# Patient Record
Sex: Female | Born: 1970 | Race: White | Hispanic: No | Marital: Married | Smoking: Never smoker
Health system: Southern US, Community
[De-identification: ages and names within clinical notes are randomized; demographics above are authoritative.]

## PROBLEM LIST (undated history)

## (undated) DIAGNOSIS — D242 Benign neoplasm of left breast: Secondary | ICD-10-CM

## (undated) DIAGNOSIS — N189 Chronic kidney disease, unspecified: Secondary | ICD-10-CM

## (undated) HISTORY — PX: KIDNEY STONE SURGERY: SHX686

---

## 1998-11-06 ENCOUNTER — Inpatient Hospital Stay (HOSPITAL_COMMUNITY): Admission: AD | Admit: 1998-11-06 | Discharge: 1998-11-08 | Payer: Self-pay | Admitting: Obstetrics and Gynecology

## 1998-11-09 ENCOUNTER — Encounter: Admission: RE | Admit: 1998-11-09 | Discharge: 1999-02-07 | Payer: Self-pay | Admitting: Obstetrics and Gynecology

## 1998-12-07 ENCOUNTER — Other Ambulatory Visit: Admission: RE | Admit: 1998-12-07 | Discharge: 1998-12-07 | Payer: Self-pay | Admitting: Obstetrics and Gynecology

## 1999-09-22 ENCOUNTER — Other Ambulatory Visit: Admission: RE | Admit: 1999-09-22 | Discharge: 1999-09-22 | Payer: Self-pay | Admitting: Obstetrics and Gynecology

## 2000-11-07 ENCOUNTER — Other Ambulatory Visit: Admission: RE | Admit: 2000-11-07 | Discharge: 2000-11-07 | Payer: Self-pay | Admitting: Obstetrics and Gynecology

## 2001-11-12 ENCOUNTER — Other Ambulatory Visit: Admission: RE | Admit: 2001-11-12 | Discharge: 2001-11-12 | Payer: Self-pay | Admitting: Obstetrics and Gynecology

## 2002-11-14 ENCOUNTER — Other Ambulatory Visit: Admission: RE | Admit: 2002-11-14 | Discharge: 2002-11-14 | Payer: Self-pay | Admitting: Obstetrics and Gynecology

## 2003-11-27 ENCOUNTER — Other Ambulatory Visit: Admission: RE | Admit: 2003-11-27 | Discharge: 2003-11-27 | Payer: Self-pay | Admitting: Obstetrics and Gynecology

## 2004-11-29 ENCOUNTER — Other Ambulatory Visit: Admission: RE | Admit: 2004-11-29 | Discharge: 2004-11-29 | Payer: Self-pay | Admitting: Obstetrics and Gynecology

## 2013-02-01 ENCOUNTER — Other Ambulatory Visit: Payer: Self-pay | Admitting: Obstetrics and Gynecology

## 2013-02-01 DIAGNOSIS — R928 Other abnormal and inconclusive findings on diagnostic imaging of breast: Secondary | ICD-10-CM

## 2013-02-06 ENCOUNTER — Ambulatory Visit
Admission: RE | Admit: 2013-02-06 | Discharge: 2013-02-06 | Disposition: A | Discharge status: Home / Self Care | Payer: BC Managed Care – PPO | Source: Ambulatory Visit | Attending: Obstetrics and Gynecology | Admitting: Obstetrics and Gynecology

## 2013-02-06 DIAGNOSIS — R928 Other abnormal and inconclusive findings on diagnostic imaging of breast: Secondary | ICD-10-CM

## 2013-02-07 ENCOUNTER — Other Ambulatory Visit: Payer: Self-pay

## 2013-02-13 ENCOUNTER — Other Ambulatory Visit: Payer: Self-pay

## 2014-12-29 ENCOUNTER — Other Ambulatory Visit: Payer: Self-pay | Admitting: Obstetrics and Gynecology

## 2014-12-29 DIAGNOSIS — N6452 Nipple discharge: Secondary | ICD-10-CM

## 2014-12-30 ENCOUNTER — Ambulatory Visit
Admission: RE | Admit: 2014-12-30 | Discharge: 2014-12-30 | Disposition: A | Discharge status: Home / Self Care | Payer: BC Managed Care – PPO | Source: Ambulatory Visit | Attending: Obstetrics and Gynecology | Admitting: Obstetrics and Gynecology

## 2014-12-30 ENCOUNTER — Other Ambulatory Visit: Payer: Self-pay | Admitting: Obstetrics and Gynecology

## 2014-12-30 DIAGNOSIS — N6452 Nipple discharge: Secondary | ICD-10-CM

## 2015-01-01 ENCOUNTER — Ambulatory Visit
Admission: RE | Admit: 2015-01-01 | Discharge: 2015-01-01 | Disposition: A | Discharge status: Home / Self Care | Payer: No Typology Code available for payment source | Source: Ambulatory Visit | Attending: Obstetrics and Gynecology | Admitting: Obstetrics and Gynecology

## 2015-01-01 DIAGNOSIS — N6452 Nipple discharge: Secondary | ICD-10-CM

## 2015-01-30 ENCOUNTER — Other Ambulatory Visit: Payer: Self-pay | Admitting: Surgery

## 2015-01-30 DIAGNOSIS — D242 Benign neoplasm of left breast: Secondary | ICD-10-CM

## 2015-02-02 ENCOUNTER — Other Ambulatory Visit: Payer: Self-pay | Admitting: Surgery

## 2015-02-02 ENCOUNTER — Encounter (HOSPITAL_BASED_OUTPATIENT_CLINIC_OR_DEPARTMENT_OTHER): Payer: Self-pay | Admitting: *Deleted

## 2015-02-02 DIAGNOSIS — D242 Benign neoplasm of left breast: Secondary | ICD-10-CM

## 2015-02-04 ENCOUNTER — Ambulatory Visit
Admission: RE | Admit: 2015-02-04 | Discharge: 2015-02-04 | Disposition: A | Discharge status: Home / Self Care | Payer: No Typology Code available for payment source | Source: Ambulatory Visit | Attending: Surgery | Admitting: Surgery

## 2015-02-04 DIAGNOSIS — D242 Benign neoplasm of left breast: Secondary | ICD-10-CM

## 2015-02-05 NOTE — H&P (Signed)
April Wilcox  Location: [facility] Patient #: [id number] DOB: 1970/03/11 Married / Language: English / Race: White Female  History of Present Illness   Patient words: left breast.   The patient is a 45 year old female who presents with a complaint of Left intraductal papillary lesion.   Her PCP is Lucita Lora, NP.  She works for [employer], Technical sales engineer.  She is accompanied by her mother in law.   The patient has noticed a nipple discharge for a few months. At first she thought this was clear, but then noticed it turned yellow. Her last mammogram was February 2016. About 2 years ago she had a recall for her left breast which proved to be benign. She had a left breast biopsy on 01/01/2015 ([id number]) which showed a intraductal papillary lesion.  She is having regular periods. Her last minstral period was January 01, 2015. She has no family history of breast cancer.  Plan: left breast lumpectomy with seed loc  Past Medical history: 1. Hemorrhoids  Social history: Married. 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 Has 2 children: 3 and 39 yo. Accompanied by mother in law  Allergies Marjean Donna, [place]; 01/23/2015 2:53 PM) No Known Drug Allergies01/06/2015  Medication History Davy Pique Bynum, CMA; 01/23/2015 2:53 PM) No Current Medications Medications Reconciled  Vitals (Sonya Bynum CMA; 01/23/2015 2:52 PM) 01/23/2015 2:52 PM Weight: 145 lb Height: 64in Body Surface Area: 1.71 m Body Mass Index: 24.89 kg/m  Pulse: 76 (Regular)  BP: 122/80 (Sitting, Left Arm, Standard)   Physical Exam  General: WN WF alert and generally healthy appearing. HEENT: Normal. Pupils equal.  Neck: Supple. No mass. No thyroid mass.  Lymph Nodes: No supraclavicular or cervical nodes.  Lungs: Clear to auscultation and symmetric breath sounds. Heart: RRR. No murmur or rub.  Breasts: Right: No mass. Left: Entry site for  biopsy at 5 o'clock. No mass in the sub areola area. No nipple discharge.  Abdomen: Soft. No mass. No tenderness. No hernia. Normal bowel sounds. Has umbilical ring.  Extremities: Good strength and ROM in upper and lower extremities.  Neurologic: Grossly intact to motor and sensory function. Psychiatric: Has normal mood and affect. Behavior is normal.   Assessment & Plan  1.  INTRADUCTAL PAPILLOMA OF BREAST, LEFT (D24.2)  Story: She had a left breast biopsy on 01/01/2015 ([id number]) which showed a intraductal papillary lesion.  Plan -   Excision biopsy of papilloma (seed localization)     Seed placed at [facility] on 02/04/2015   Alphonsa Overall, MD, [facility] Pager: [phone] Office phone:  [phone]

## 2015-02-05 NOTE — Anesthesia Preprocedure Evaluation (Addendum)
Anesthesia Evaluation  Patient identified by MRN, date of birth, ID band Patient awake    History of Anesthesia Complications Negative for: history of anesthetic complications  Airway Mallampati: I  TM Distance: >3 FB Neck ROM: Full    Dental  (+) Teeth Intact   Pulmonary neg pulmonary ROS,    breath sounds clear to auscultation       Cardiovascular negative cardio ROS   Rhythm:Regular Rate:Normal     Neuro/Psych    GI/Hepatic negative GI ROS, Neg liver ROS,   Endo/Other  negative endocrine ROS  Renal/GU negative Renal ROS     Musculoskeletal   Abdominal   Peds  Hematology   Anesthesia Other Findings   Reproductive/Obstetrics                            Anesthesia Physical Anesthesia Plan  ASA: I  Anesthesia Plan: General   Post-op Pain Management:    Induction: Intravenous  Airway Management Planned: LMA  Additional Equipment:   Intra-op Plan:   Post-operative Plan: Extubation in OR  Informed Consent: I have reviewed the patients History and Physical, chart, labs and discussed the procedure including the risks, benefits and alternatives for the proposed anesthesia with the patient or authorized representative who has indicated his/her understanding and acceptance.     Plan Discussed with:   Anesthesia Plan Comments:        Anesthesia Quick Evaluation

## 2015-02-06 ENCOUNTER — Ambulatory Visit (HOSPITAL_BASED_OUTPATIENT_CLINIC_OR_DEPARTMENT_OTHER): Payer: BC Managed Care – PPO | Admitting: Anesthesiology

## 2015-02-06 ENCOUNTER — Encounter (HOSPITAL_BASED_OUTPATIENT_CLINIC_OR_DEPARTMENT_OTHER)
Admission: RE | Disposition: A | Discharge status: Home / Self Care | Payer: Self-pay | Source: Ambulatory Visit | Attending: Surgery

## 2015-02-06 ENCOUNTER — Ambulatory Visit
Admission: RE | Admit: 2015-02-06 | Discharge: 2015-02-06 | Disposition: A | Discharge status: Home / Self Care | Payer: No Typology Code available for payment source | Source: Ambulatory Visit | Attending: Surgery | Admitting: Surgery

## 2015-02-06 ENCOUNTER — Ambulatory Visit (HOSPITAL_BASED_OUTPATIENT_CLINIC_OR_DEPARTMENT_OTHER)
Admission: RE | Admit: 2015-02-06 | Discharge: 2015-02-06 | Disposition: A | Discharge status: Home / Self Care | Payer: BC Managed Care – PPO | Source: Ambulatory Visit | Attending: Surgery | Admitting: Surgery

## 2015-02-06 ENCOUNTER — Encounter (HOSPITAL_BASED_OUTPATIENT_CLINIC_OR_DEPARTMENT_OTHER): Payer: Self-pay

## 2015-02-06 DIAGNOSIS — D242 Benign neoplasm of left breast: Secondary | ICD-10-CM | POA: Insufficient documentation

## 2015-02-06 HISTORY — DX: Benign neoplasm of left breast: D24.2

## 2015-02-06 HISTORY — DX: Chronic kidney disease, unspecified: N18.9

## 2015-02-06 HISTORY — PX: BREAST LUMPECTOMY WITH RADIOACTIVE SEED LOCALIZATION: SHX6424

## 2015-02-06 SURGERY — BREAST LUMPECTOMY WITH RADIOACTIVE SEED LOCALIZATION
Anesthesia: General | Site: Breast | Laterality: Left

## 2015-02-06 MED ORDER — CHLORHEXIDINE GLUCONATE 4 % EX LIQD: 1.0000 "application " | Freq: Once | CUTANEOUS | Status: DC

## 2015-02-06 MED ORDER — BUPIVACAINE-EPINEPHRINE (PF) 0.5% -1:200000 IJ SOLN
INTRAMUSCULAR | Status: DC | PRN
  Administered 2015-02-06: 30 mL

## 2015-02-06 MED ORDER — LACTATED RINGERS IV SOLN
INTRAVENOUS | Status: DC
  Administered 2015-02-06: 07:00:00 via INTRAVENOUS

## 2015-02-06 MED ORDER — SUCCINYLCHOLINE CHLORIDE 20 MG/ML IJ SOLN
INTRAMUSCULAR | Status: AC
  Filled 2015-02-06: qty 1

## 2015-02-06 MED ORDER — PROPOFOL 10 MG/ML IV BOLUS
INTRAVENOUS | Status: DC | PRN
  Administered 2015-02-06: 120 mg via INTRAVENOUS

## 2015-02-06 MED ORDER — DEXAMETHASONE SODIUM PHOSPHATE 10 MG/ML IJ SOLN
INTRAMUSCULAR | Status: AC
  Filled 2015-02-06: qty 1

## 2015-02-06 MED ORDER — SCOPOLAMINE 1 MG/3DAYS TD PT72: 1.0000 | MEDICATED_PATCH | Freq: Once | TRANSDERMAL | Status: DC

## 2015-02-06 MED ORDER — PROMETHAZINE HCL 25 MG/ML IJ SOLN
6.2500 mg | INTRAMUSCULAR | Status: DC | PRN
Start: 2015-02-06 — End: 2015-02-06

## 2015-02-06 MED ORDER — DEXAMETHASONE SODIUM PHOSPHATE 4 MG/ML IJ SOLN
INTRAMUSCULAR | Status: DC | PRN
  Administered 2015-02-06: 10 mg via INTRAVENOUS

## 2015-02-06 MED ORDER — HYDROMORPHONE HCL 1 MG/ML IJ SOLN: 0.2500 mg | INTRAMUSCULAR | Status: DC | PRN

## 2015-02-06 MED ORDER — LIDOCAINE HCL (CARDIAC) 20 MG/ML IV SOLN
INTRAVENOUS | Status: DC | PRN
  Administered 2015-02-06: 80 mg via INTRAVENOUS

## 2015-02-06 MED ORDER — MIDAZOLAM HCL 2 MG/2ML IJ SOLN
INTRAMUSCULAR | Status: AC
  Filled 2015-02-06: qty 2

## 2015-02-06 MED ORDER — BUPIVACAINE-EPINEPHRINE (PF) 0.25% -1:200000 IJ SOLN
INTRAMUSCULAR | Status: AC
  Filled 2015-02-06: qty 30

## 2015-02-06 MED ORDER — FENTANYL CITRATE (PF) 100 MCG/2ML IJ SOLN
INTRAMUSCULAR | Status: AC
  Filled 2015-02-06: qty 2

## 2015-02-06 MED ORDER — ONDANSETRON HCL 4 MG/2ML IJ SOLN
INTRAMUSCULAR | Status: DC | PRN
  Administered 2015-02-06: 4 mg via INTRAVENOUS

## 2015-02-06 MED ORDER — CEFAZOLIN SODIUM-DEXTROSE 2-3 GM-% IV SOLR
INTRAVENOUS | Status: AC
  Filled 2015-02-06: qty 50

## 2015-02-06 MED ORDER — PROPOFOL 500 MG/50ML IV EMUL
INTRAVENOUS | Status: AC
  Filled 2015-02-06: qty 50

## 2015-02-06 MED ORDER — FENTANYL CITRATE (PF) 100 MCG/2ML IJ SOLN
50.0000 ug | INTRAMUSCULAR | Status: DC | PRN
  Administered 2015-02-06 (×2): 50 ug via INTRAVENOUS

## 2015-02-06 MED ORDER — ONDANSETRON HCL 4 MG/2ML IJ SOLN
INTRAMUSCULAR | Status: AC
  Filled 2015-02-06: qty 2

## 2015-02-06 MED ORDER — LIDOCAINE HCL (CARDIAC) 20 MG/ML IV SOLN
INTRAVENOUS | Status: AC
  Filled 2015-02-06: qty 5

## 2015-02-06 MED ORDER — BUPIVACAINE-EPINEPHRINE (PF) 0.5% -1:200000 IJ SOLN
INTRAMUSCULAR | Status: AC
  Filled 2015-02-06: qty 30

## 2015-02-06 MED ORDER — CEFAZOLIN SODIUM-DEXTROSE 2-3 GM-% IV SOLR
2.0000 g | INTRAVENOUS | Status: AC
  Administered 2015-02-06: 2 g via INTRAVENOUS

## 2015-02-06 MED ORDER — MIDAZOLAM HCL 2 MG/2ML IJ SOLN
1.0000 mg | INTRAMUSCULAR | Status: DC | PRN
Start: 2015-02-06 — End: 2015-02-06
  Administered 2015-02-06: 2 mg via INTRAVENOUS

## 2015-02-06 MED ORDER — HYDROCODONE-ACETAMINOPHEN 5-325 MG PO TABS: 1.0000 | ORAL_TABLET | Freq: Four times a day (QID) | ORAL | Status: DC | PRN

## 2015-02-06 MED ORDER — GLYCOPYRROLATE 0.2 MG/ML IJ SOLN: 0.2000 mg | Freq: Once | INTRAMUSCULAR | Status: DC | PRN

## 2015-02-06 SURGICAL SUPPLY — 52 items
APL SKNCLS STERI-STRIP NONHPOA (GAUZE/BANDAGES/DRESSINGS)
BENZOIN TINCTURE PRP APPL 2/3 (GAUZE/BANDAGES/DRESSINGS) IMPLANT
BINDER BREAST LRG (GAUZE/BANDAGES/DRESSINGS) ×1 IMPLANT
BINDER BREAST MEDIUM (GAUZE/BANDAGES/DRESSINGS) IMPLANT
BINDER BREAST XLRG (GAUZE/BANDAGES/DRESSINGS) IMPLANT
BINDER BREAST XXLRG (GAUZE/BANDAGES/DRESSINGS) IMPLANT
BLADE HEX COATED 2.75 (ELECTRODE) IMPLANT
BLADE SURG 10 STRL SS (BLADE) ×2 IMPLANT
BLADE SURG 15 STRL LF DISP TIS (BLADE) ×1 IMPLANT
BLADE SURG 15 STRL SS (BLADE) ×2
CANISTER SUC SOCK COL 7IN (MISCELLANEOUS) IMPLANT
CANISTER SUCT 1200ML W/VALVE (MISCELLANEOUS) ×2 IMPLANT
CHLORAPREP W/TINT 26ML (MISCELLANEOUS) ×2 IMPLANT
CLIP TI WIDE RED SMALL 6 (CLIP) IMPLANT
COVER BACK TABLE 60X90IN (DRAPES) ×2 IMPLANT
COVER MAYO STAND STRL (DRAPES) ×2 IMPLANT
COVER PROBE W GEL 5X96 (DRAPES) ×2 IMPLANT
DECANTER SPIKE VIAL GLASS SM (MISCELLANEOUS) IMPLANT
DEVICE DUBIN W/COMP PLATE 8390 (MISCELLANEOUS) ×2 IMPLANT
DRAPE LAPAROTOMY 100X72 PEDS (DRAPES) ×2 IMPLANT
DRAPE UTILITY XL STRL (DRAPES) ×2 IMPLANT
DRSG PAD ABDOMINAL 8X10 ST (GAUZE/BANDAGES/DRESSINGS) IMPLANT
ELECT COATED BLADE 2.86 ST (ELECTRODE) ×2 IMPLANT
ELECT REM PT RETURN 9FT ADLT (ELECTROSURGICAL) ×2
ELECTRODE REM PT RTRN 9FT ADLT (ELECTROSURGICAL) ×1 IMPLANT
GLOVE BIO SURGEON STRL SZ 6.5 (GLOVE) ×2 IMPLANT
GLOVE BIOGEL PI IND STRL 7.0 (GLOVE) IMPLANT
GLOVE BIOGEL PI INDICATOR 7.0 (GLOVE) ×3
GLOVE SURG SIGNA 7.5 PF LTX (GLOVE) ×2 IMPLANT
GOWN STRL REUS W/ TWL LRG LVL3 (GOWN DISPOSABLE) ×1 IMPLANT
GOWN STRL REUS W/ TWL XL LVL3 (GOWN DISPOSABLE) ×1 IMPLANT
GOWN STRL REUS W/TWL LRG LVL3 (GOWN DISPOSABLE) ×6
GOWN STRL REUS W/TWL XL LVL3 (GOWN DISPOSABLE) ×2
KIT MARKER MARGIN INK (KITS) ×2 IMPLANT
LIQUID BAND (GAUZE/BANDAGES/DRESSINGS) ×2 IMPLANT
NDL HYPO 25X1 1.5 SAFETY (NEEDLE) ×1 IMPLANT
NEEDLE HYPO 25X1 1.5 SAFETY (NEEDLE) ×2 IMPLANT
NS IRRIG 1000ML POUR BTL (IV SOLUTION) IMPLANT
PACK BASIN DAY SURGERY FS (CUSTOM PROCEDURE TRAY) ×2 IMPLANT
PENCIL BUTTON HOLSTER BLD 10FT (ELECTRODE) ×2 IMPLANT
SHEET MEDIUM DRAPE 40X70 STRL (DRAPES) ×1 IMPLANT
SLEEVE SCD COMPRESS KNEE MED (MISCELLANEOUS) ×2 IMPLANT
SPONGE GAUZE 4X4 12PLY STER LF (GAUZE/BANDAGES/DRESSINGS) IMPLANT
SPONGE LAP 18X18 X RAY DECT (DISPOSABLE) ×2 IMPLANT
STRIP CLOSURE SKIN 1/4X4 (GAUZE/BANDAGES/DRESSINGS) IMPLANT
SUT MON AB 5-0 PS2 18 (SUTURE) ×2 IMPLANT
SUT VICRYL 3-0 CR8 SH (SUTURE) ×2 IMPLANT
SYR CONTROL 10ML LL (SYRINGE) ×2 IMPLANT
TOWEL OR 17X24 6PK STRL BLUE (TOWEL DISPOSABLE) ×2 IMPLANT
TOWEL OR NON WOVEN STRL DISP B (DISPOSABLE) ×2 IMPLANT
TUBE CONNECTING 20X1/4 (TUBING) ×2 IMPLANT
YANKAUER SUCT BULB TIP NO VENT (SUCTIONS) ×2 IMPLANT

## 2015-02-06 NOTE — Anesthesia Procedure Notes (Signed)
Procedure Name: LMA Insertion Date/Time: 02/06/2015 7:31 AM Performed by: Lyndee Leo Pre-anesthesia Checklist: Patient identified, Emergency Drugs available, Suction available and Patient being monitored Patient Re-evaluated:Patient Re-evaluated prior to inductionOxygen Delivery Method: Circle System Utilized Preoxygenation: Pre-oxygenation with 100% oxygen Intubation Type: IV induction Ventilation: Mask ventilation without difficulty LMA: LMA inserted LMA Size: 4.0 Number of attempts: 1 Airway Equipment and Method: Bite block Placement Confirmation: positive ETCO2 Tube secured with: Tape Dental Injury: Teeth and Oropharynx as per pre-operative assessment

## 2015-02-06 NOTE — Anesthesia Postprocedure Evaluation (Signed)
Anesthesia Post Note  Patient: April Wilcox  Procedure(s) Performed: Procedure(s) (LRB): LEFT BREAST LUMPECTOMY WITH RADIOACTIVE SEED LOCALIZATION (Left)  Patient location during evaluation: PACU Anesthesia Type: General Level of consciousness: awake and alert Pain management: pain level controlled Vital Signs Assessment: post-procedure vital signs reviewed and stable Respiratory status: spontaneous breathing, nonlabored ventilation, respiratory function stable and patient connected to nasal cannula oxygen Cardiovascular status: blood pressure returned to baseline and stable Postop Assessment: no signs of nausea or vomiting Anesthetic complications: no    Last Vitals:  Filed Vitals:   02/06/15 0910 02/06/15 0935  BP: 119/82 125/81  Pulse: 80 82  Temp:  36.7 C  Resp: 14 16    Last Pain:  Filed Vitals:   02/06/15 0937  PainSc: 0-No pain                 Taysha Majewski,JAMES TERRILL

## 2015-02-06 NOTE — Interval H&P Note (Signed)
History and Physical Interval Note:  02/06/2015 7:23 AM  April Wilcox  has presented today for surgery, with the diagnosis of left breast papilloma  The various methods of treatment have been discussed with the patient and family.  Seed in good location by neoprobe.  Husband and mother in room with patient.  After consideration of risks, benefits and other options for treatment, the patient has consented to  Procedure(s): LEFT BREAST LUMPECTOMY WITH RADIOACTIVE SEED LOCALIZATION (Left) as a surgical intervention .  The patient's history has been reviewed, patient examined, no change in status, stable for surgery.  I have reviewed the patient's chart and labs.  Questions were answered to the patient's satisfaction.     Laketa Sandoz H

## 2015-02-06 NOTE — Transfer of Care (Signed)
Immediate Anesthesia Transfer of Care Note  Patient: April Wilcox  Procedure(s) Performed: Procedure(s): LEFT BREAST LUMPECTOMY WITH RADIOACTIVE SEED LOCALIZATION (Left)  Patient Location: PACU  Anesthesia Type:General  Level of Consciousness: awake, sedated and patient cooperative  Airway & Oxygen Therapy: Patient Spontanous Breathing and Patient connected to face mask oxygen  Post-op Assessment: Report given to RN and Post -op Vital signs reviewed and stable  Post vital signs: Reviewed and stable  Last Vitals:  Filed Vitals:   02/06/15 0637  BP: 111/75  Pulse: 73  Temp: 36.7 C  Resp: 20    Complications: No apparent anesthesia complications

## 2015-02-06 NOTE — Op Note (Signed)
02/06/2015  8:37 AM  PATIENT:  April Wilcox DOB: Aug 27, 1970 MRN: [id number]  PREOP DIAGNOSIS:  left breast papilloma  POSTOP DIAGNOSIS:   left breast papilloma, subareolar at 5 o'clock position   PROCEDURE:   Procedure(s):  LEFT BREAST LUMPECTOMY WITH RADIOACTIVE SEED LOCALIZATION,   SURGEON:   Alphonsa Overall, M.D.  ANESTHESIA:   general  Anesthesiologist: Rica Koyanagi, MD CRNA: Lyndee Leo, CRNA  General  EBL:  minimal  ml  DRAINS: none   LOCAL MEDICATIONS USED:   29 cc 1/2% marcaine  SPECIMEN:   Left breast biopsy, suture medial  COUNTS CORRECT:  YES  INDICATIONS FOR PROCEDURE:  [name] is a 45 y.o. (DOB: [birth year]) white  female whose primary care physician is Charlynn Court, NP and comes for left breast lumpectomy.   Ms. Cutright had a nipple discharge and biopsy of her left breast revealed a papilloma.  She now comes for excision of that part of her breast.   The indications and potential complications of surgery were explained to the patient. Potential complications include, but are not limited to, bleeding, infection, the need for further surgery, and nerve injury.     She had a I131 seed placed on 02/04/2015 in her left breast at [facility].  I confirmed the presence of the I131 seed in the pre op area using the Neoprobe.  The seed is in the subareolar 5 o'clock position of the left breast.     OPERATIVE NOTE:   The patient was taken to room # 1 at [facility] where she underwent a general anesthesia  supervised by Anesthesiologist: Rica Koyanagi, MD CRNA: Lyndee Leo, CRNA. Her left breast and axilla were prepped with  ChloraPrep and sterilely draped.    A time-out and the surgical check list was reviewed.    The tumor is in the subareolar position at 5 o'clock position of the left breast.   I used the Neoprobe to identify the I131 seed.  I tried to excise an area around the tumor of at least 1 cm.    I excised this block of breast tissue  approximately 3 cm by 3 cm  in diameter.  I placed a suture at the medial aspect of the biopsy.   I painted the lumpectomy specimen with the 6 color paint kit and did a specimen mammogram which confirmed the mass, clip, and the seed were all in the right position in the specimen.  The specimen was sent to pathology who called back to confirm that they have the seed and the specimen.   I then irrigated the wound with saline. I infiltrated approximately 29 mL of 1% local between the incisions.  I then closed all the wounds in layers using 3-0 Vicryl sutures for the deep layer. At the skin, I closed the incisions with a 4-0 Monocryl suture. The incision was then painted with LiquiBand.  She had gauze place over the wounds and placed in a breast binder.   The patient tolerated the procedure well, was transported to the recovery room in good condition. Sponge and needle count were correct at the end of the case.   Final pathology is pending.   Alphonsa Overall, MD, [facility] Pager: [phone] Office phone:  [phone]

## 2015-02-06 NOTE — Discharge Instructions (Signed)
[facility] - DISCHARGE INSTRUCTIONS TO PATIENT  Activity:  Driving - In one or two days   Lifting - take it easy for about 5 days, then no limits  Wound Care:   Leave bandage on for 2 days, then may remove bandage and shower.  Diet:  As tolerated  Follow up appointment:  Call Dr. Pollie Friar office [facility]) at [phone] for an appointment in 2 to 3 weeks.  Medications and dosages:  Resume your home medications.  You have a prescription for:  Vicodin  Call Dr. Lucia Gaskins or his office  [phone]) if you have:  Temperature greater than 100.4,  Persistent nausea and vomiting,  Severe uncontrolled pain,  Redness, tenderness, or signs of infection (pain, swelling, redness, odor or green/yellow discharge around the site),  Difficulty breathing, headache or visual disturbances,  Any other questions or concerns you may have after discharge.  In an emergency, call 911 or go to an Emergency Department at a nearby hospital.    Post Anesthesia Home Care Instructions  Activity: Get plenty of rest for the remainder of the day. A responsible adult should stay with you for 24 hours following the procedure.  For the next 24 hours, DO NOT: -Drive a car -Paediatric nurse -Drink alcoholic beverages -Take any medication unless instructed by your physician -Make any legal decisions or sign important papers.  Meals: Start with liquid foods such as gelatin or soup. Progress to regular foods as tolerated. Avoid greasy, spicy, heavy foods. If nausea and/or vomiting occur, drink only clear liquids until the nausea and/or vomiting subsides. Call your physician if vomiting continues.  Special Instructions/Symptoms: Your throat may feel dry or sore from the anesthesia or the breathing tube placed in your throat during surgery. If this causes discomfort, gargle with warm salt water. The discomfort should disappear within 24 hours.  If you had a scopolamine patch placed  behind your ear for the management of post- operative nausea and/or vomiting:  1. The medication in the patch is effective for 72 hours, after which it should be removed.  Wrap patch in a tissue and discard in the trash. Wash hands thoroughly with soap and water. 2. You may remove the patch earlier than 72 hours if you experience unpleasant side effects which may include dry mouth, dizziness or visual disturbances. 3. Avoid touching the patch. Wash your hands with soap and water after contact with the patch.

## 2015-02-09 ENCOUNTER — Encounter (HOSPITAL_BASED_OUTPATIENT_CLINIC_OR_DEPARTMENT_OTHER): Payer: Self-pay | Admitting: Surgery

## 2015-03-10 ENCOUNTER — Other Ambulatory Visit: Payer: Self-pay | Admitting: Obstetrics and Gynecology

## 2015-03-11 LAB — CYTOLOGY - PAP

## 2016-01-28 ENCOUNTER — Encounter: Payer: Self-pay | Admitting: Internal Medicine

## 2016-02-11 ENCOUNTER — Ambulatory Visit: Payer: No Typology Code available for payment source | Admitting: Internal Medicine

## 2016-05-12 ENCOUNTER — Other Ambulatory Visit: Payer: Self-pay | Admitting: Obstetrics and Gynecology

## 2016-05-13 LAB — CYTOLOGY - PAP

## 2017-07-19 ENCOUNTER — Other Ambulatory Visit: Payer: Self-pay | Admitting: Obstetrics and Gynecology

## 2017-07-19 DIAGNOSIS — N6310 Unspecified lump in the right breast, unspecified quadrant: Secondary | ICD-10-CM

## 2017-07-24 ENCOUNTER — Ambulatory Visit
Admission: RE | Admit: 2017-07-24 | Discharge: 2017-07-24 | Disposition: A | Discharge status: Home / Self Care | Payer: No Typology Code available for payment source | Source: Ambulatory Visit | Attending: Obstetrics and Gynecology | Admitting: Obstetrics and Gynecology

## 2017-07-24 DIAGNOSIS — N6310 Unspecified lump in the right breast, unspecified quadrant: Secondary | ICD-10-CM

## 2017-09-20 IMAGING — MG MM DIAGNOSTIC UNILATERAL L
2 series · 2 of 2 positions shown · non-contrast
Comparison: Previous exam(s).

CLINICAL DATA: Status post ultrasound-guided core needle biopsy of
left breast 530 o'clock possible intraductal nodule.

EXAM:
DIAGNOSTIC LEFT MAMMOGRAM POST ULTRASOUND BIOPSY

[L CC]
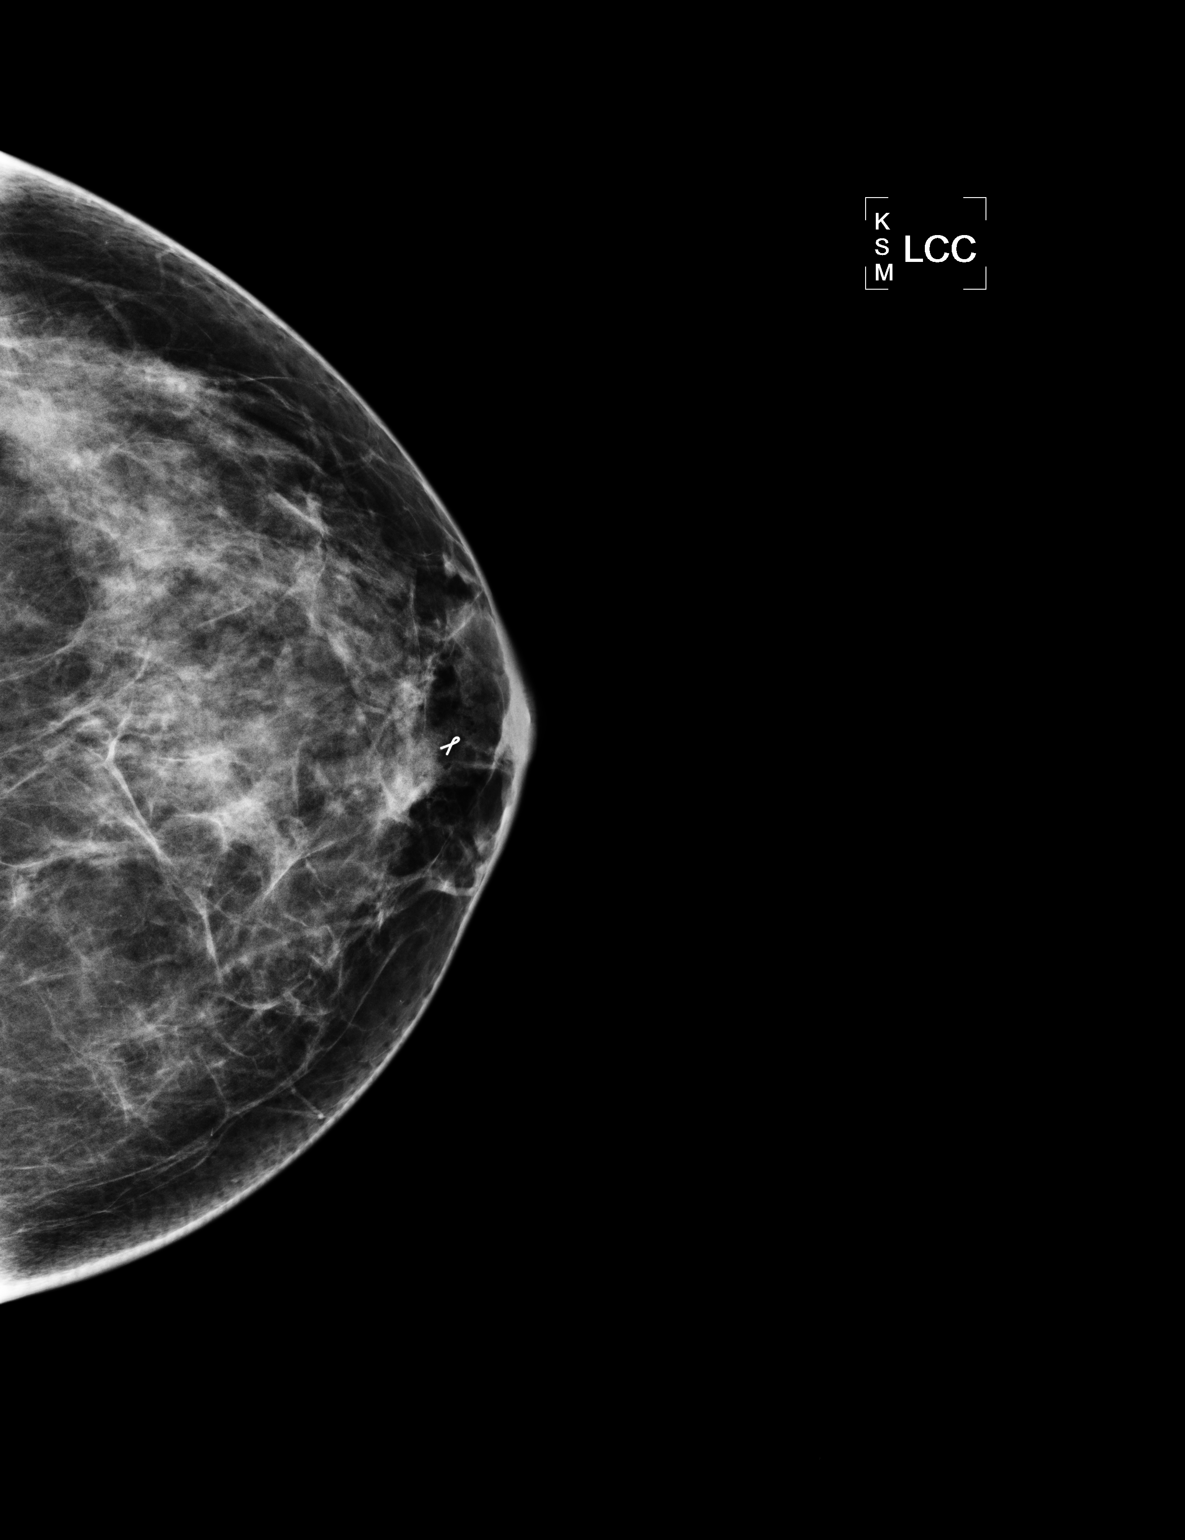

[L ML]
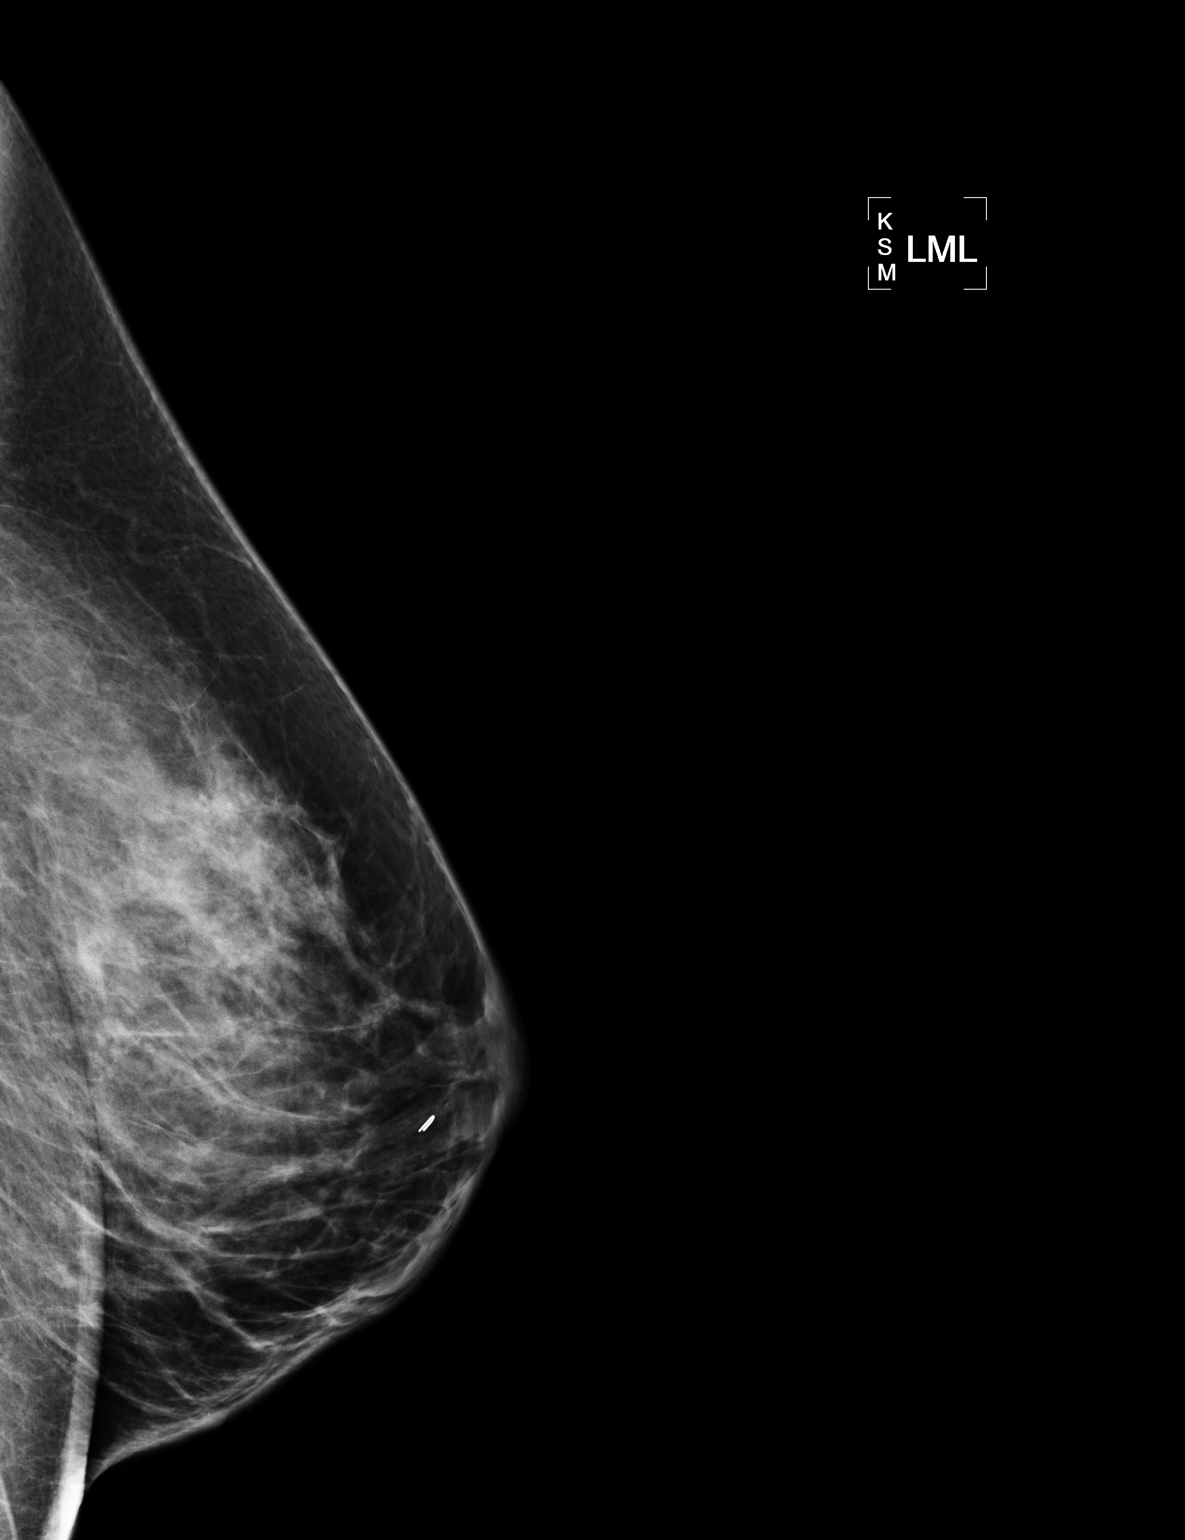

[2 of 2 positions shown; findings below may reference images not displayed]

FINDINGS: Mammographic images were obtained following ultrasound guided biopsy
of left breast 530 o'clock intraductal nodule versus debris.
Two-view mammography demonstrates presence of ribbon shaped metallic
marker at the biopsy site. Expected post biopsy changes are seen.
IMPRESSION: Successful post biopsy tissue marker placement, status post
ultrasound-guided biopsy of left breast nodule.

Final Assessment: Post Procedure Mammograms for Marker Placement

## 2017-10-24 IMAGING — MG MM LT RADIOACTIVE SEED LOC MAMMO GUIDE
6 series · 6 of 6 positions shown · non-contrast
Comparison: Previous exam(s).

CLINICAL DATA: Biopsy proven intraductal papillary lesion in the
left breast.

EXAM:
MAMMOGRAPHIC GUIDED RADIOACTIVE SEED LOCALIZATION OF THE LEFT BREAST

[L CC (1 of 3)]
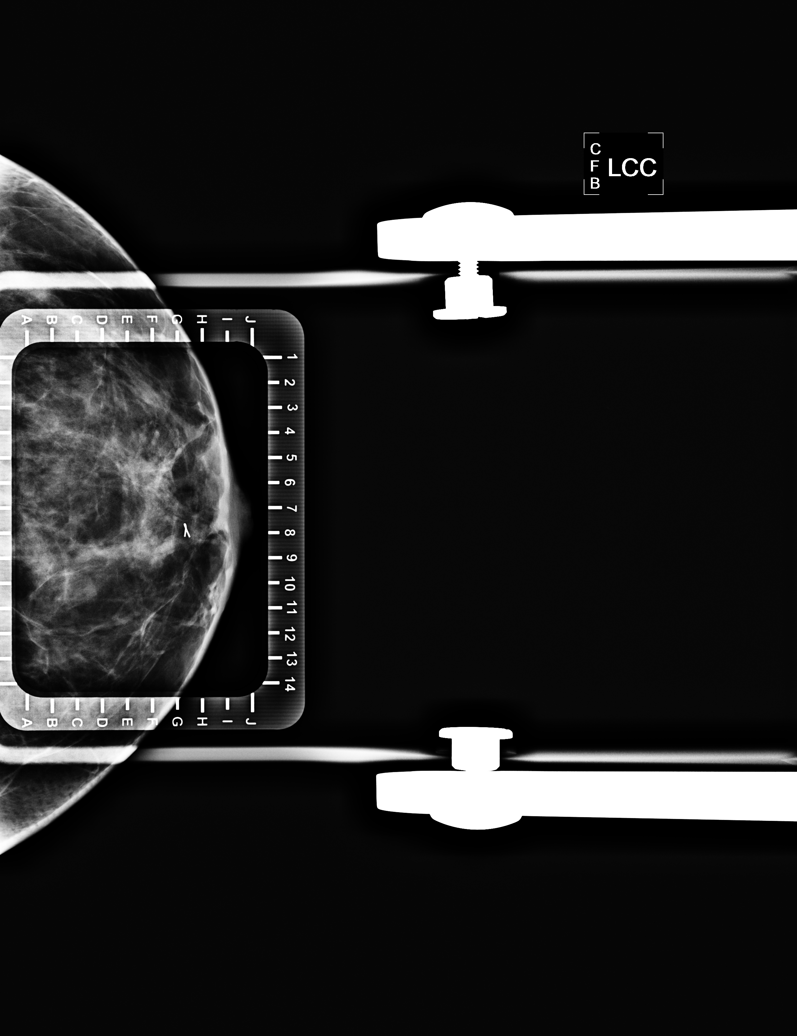

[L CC (2 of 3)]
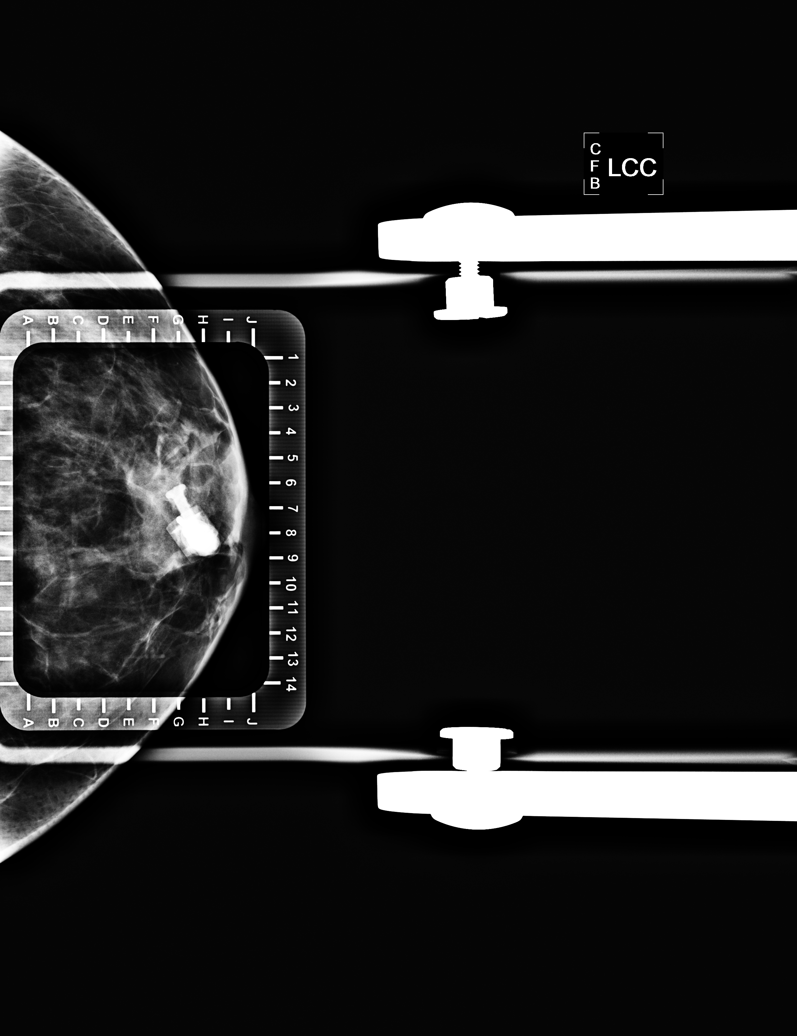

[L ML (1 of 3)]
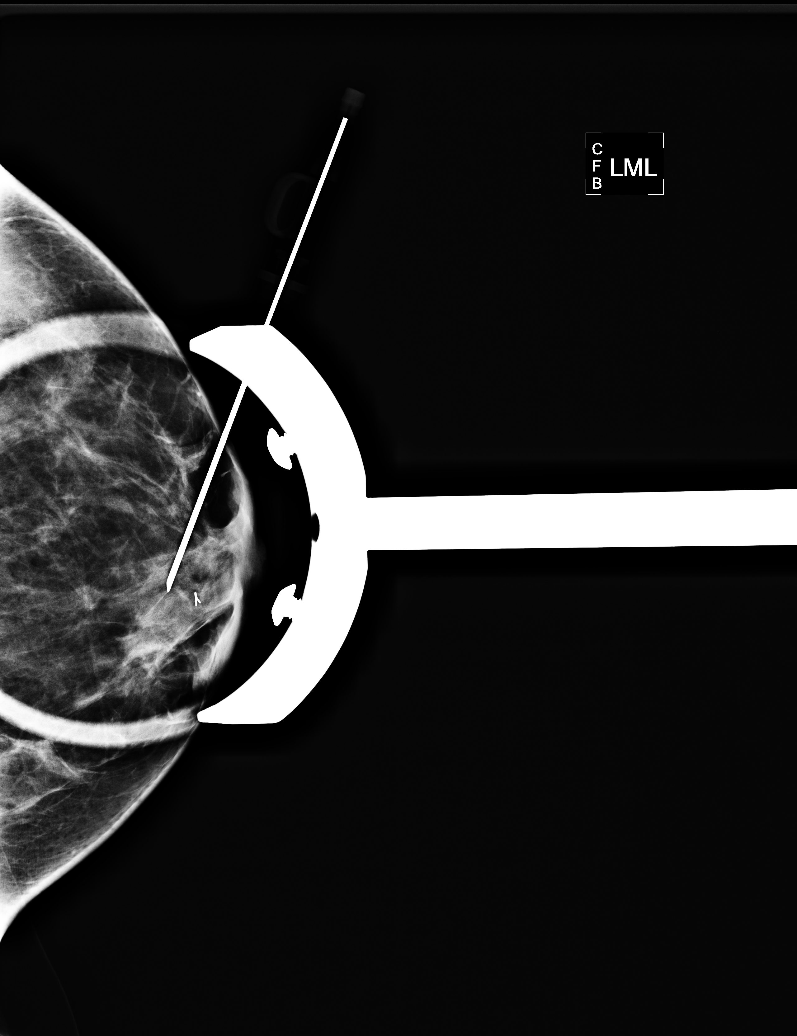

[L ML (2 of 3)]
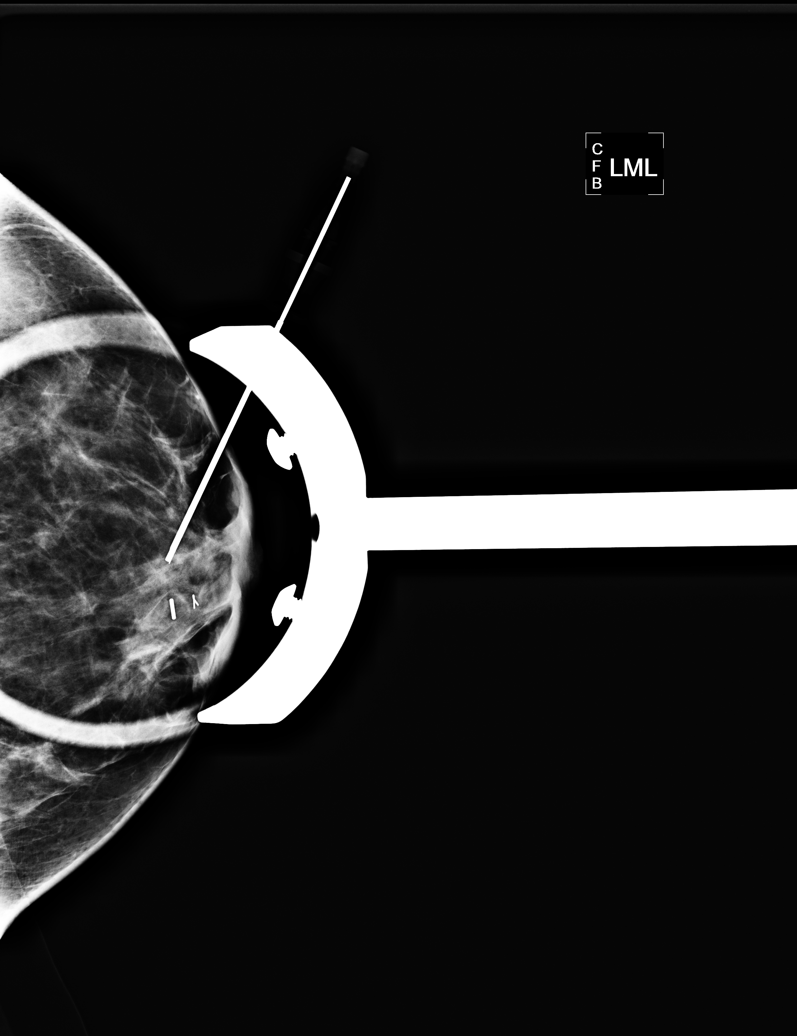

[L ML (3 of 3)]
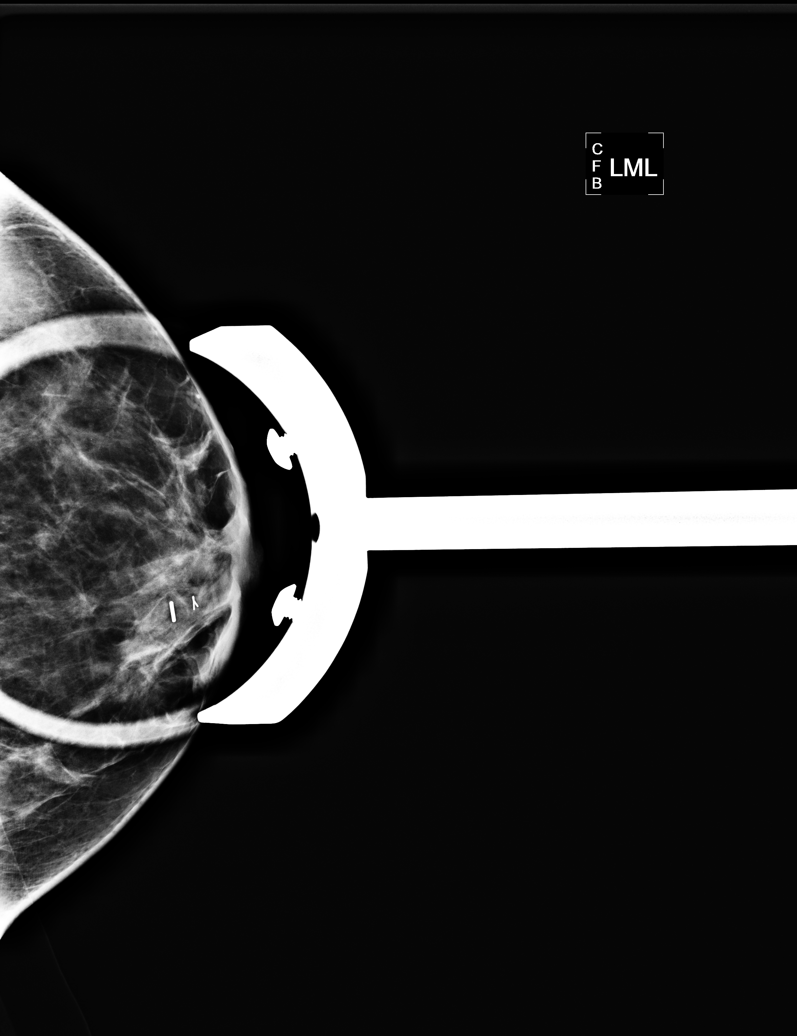

[L CC (3 of 3)]
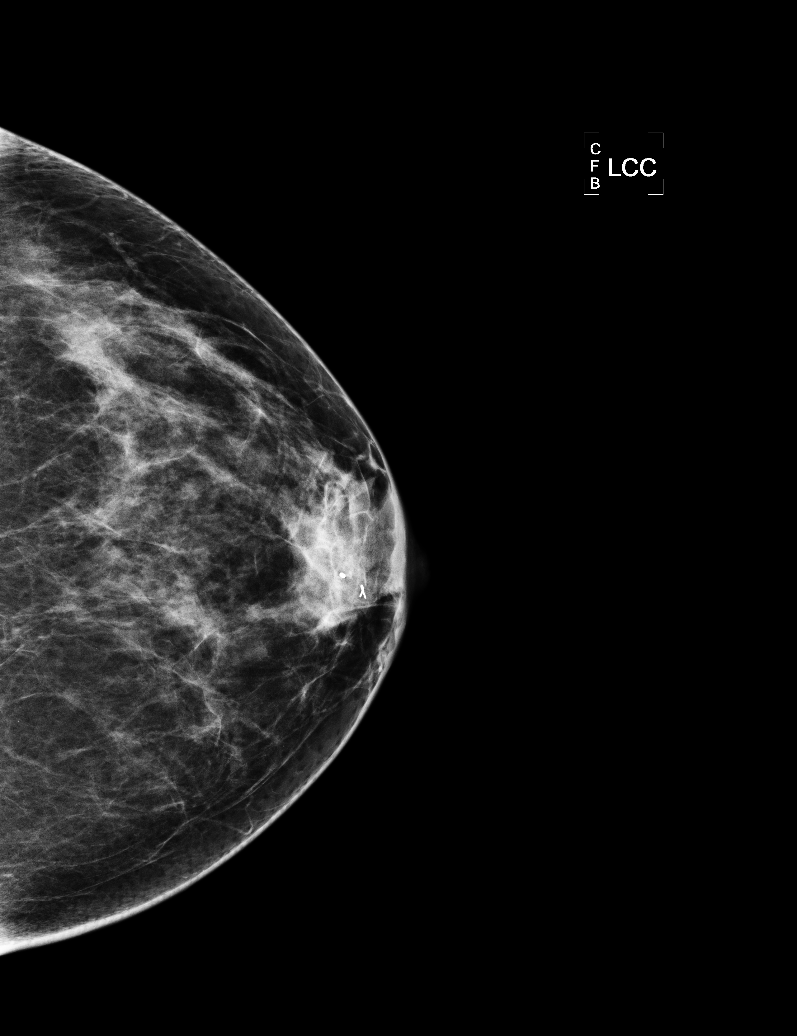

[6 of 6 positions shown; findings below may reference images not displayed]



The usual time-out protocol was performed immediately prior to the
procedure.

Using mammographic guidance, sterile technique, 1% lidocaine and an
Z-VKO radioactive seed, the ribbon shaped clip was localized using a
superior to inferior approach. The follow-up mammogram images
confirm the seed in the expected location and were marked for Dr.
Bakiev.

Follow-up survey of the patient confirms presence of the radioactive
seed.

Order number of Z-VKO seed:  [id number].

Total activity:  0.252 millicuries  Reference Date: 02/04/2015

The patient tolerated the procedure well and was released from the
[REDACTED]. She was given instructions regarding seed removal.
IMPRESSION: Radioactive seed localization left breast. No apparent
complications.

## 2019-09-15 ENCOUNTER — Emergency Department (HOSPITAL_COMMUNITY): Payer: BC Managed Care – PPO

## 2019-09-15 ENCOUNTER — Encounter (HOSPITAL_COMMUNITY): Payer: Self-pay | Admitting: Emergency Medicine

## 2019-09-15 ENCOUNTER — Other Ambulatory Visit: Payer: Self-pay

## 2019-09-15 ENCOUNTER — Emergency Department (HOSPITAL_COMMUNITY)
Admission: EM | Admit: 2019-09-15 | Discharge: 2019-09-15 | Disposition: A | Discharge status: Home / Self Care | Payer: BC Managed Care – PPO | Attending: Emergency Medicine | Admitting: Emergency Medicine

## 2019-09-15 DIAGNOSIS — R42 Dizziness and giddiness: Secondary | ICD-10-CM | POA: Diagnosis not present

## 2019-09-15 DIAGNOSIS — R519 Headache, unspecified: Secondary | ICD-10-CM | POA: Insufficient documentation

## 2019-09-15 DIAGNOSIS — Z9012 Acquired absence of left breast and nipple: Secondary | ICD-10-CM | POA: Insufficient documentation

## 2019-09-15 DIAGNOSIS — R202 Paresthesia of skin: Secondary | ICD-10-CM | POA: Diagnosis not present

## 2019-09-15 DIAGNOSIS — N189 Chronic kidney disease, unspecified: Secondary | ICD-10-CM | POA: Insufficient documentation

## 2019-09-15 LAB — TROPONIN I (HIGH SENSITIVITY)
Troponin I (High Sensitivity): <2 ng/L (ref <18)
Troponin I (High Sensitivity): <2 ng/L (ref <18)

## 2019-09-15 LAB — URINALYSIS, ROUTINE W REFLEX MICROSCOPIC
Bilirubin Urine: NEGATIVE
Glucose, UA: NEGATIVE mg/dL
Hgb urine dipstick: NEGATIVE
Ketones, ur: NEGATIVE mg/dL
Leukocytes,Ua: NEGATIVE
Nitrite: NEGATIVE
Protein, ur: NEGATIVE mg/dL
Specific Gravity, Urine: 1.008 (ref 1.005–1.030)
pH: 7 (ref 5.0–8.0)

## 2019-09-15 LAB — BASIC METABOLIC PANEL
Anion gap: 9 (ref 5–15)
BUN: 6 mg/dL (ref 6–20)
CO2: 22 mmol/L (ref 22–32)
Calcium: 9.4 mg/dL (ref 8.9–10.3)
Chloride: 106 mmol/L (ref 98–111)
Creatinine, Ser: 0.7 mg/dL (ref 0.44–1.00)
GFR calc Af Amer: >60 mL/min (ref >60)
GFR calc non Af Amer: >60 mL/min (ref >60)
Glucose, Bld: 94 mg/dL (ref 70–99)
Potassium: 3.8 mmol/L (ref 3.5–5.1)
Sodium: 137 mmol/L (ref 135–145)

## 2019-09-15 LAB — CBC
HCT: 44.7 % (ref 36.0–46.0)
Hemoglobin: 14.9 g/dL (ref 12.0–15.0)
MCH: 28.9 pg (ref 26.0–34.0)
MCHC: 33.3 g/dL (ref 30.0–36.0)
MCV: 86.6 fL (ref 80.0–100.0)
Platelets: 382 10*3/uL (ref 150–400)
RBC: 5.16 MIL/uL — ABNORMAL HIGH (ref 3.87–5.11)
RDW: 13.5 % (ref 11.5–15.5)
WBC: 9.3 10*3/uL (ref 4.0–10.5)
nRBC: 0 % (ref 0.0–0.2)

## 2019-09-15 MED ORDER — IOHEXOL 350 MG/ML SOLN
75.0000 mL | Freq: Once | INTRAVENOUS | Status: AC | PRN
  Administered 2019-09-15: 75 mL via INTRAVENOUS

## 2019-09-15 NOTE — ED Notes (Signed)
Patient verbalizes understanding of discharge instructions. Opportunity for questioning and answers were provided. Armband removed by staff, pt discharged from ED ambulatory to home.  

## 2019-09-15 NOTE — ED Triage Notes (Signed)
VAN negative 

## 2019-09-15 NOTE — ED Notes (Signed)
Pt transported to MRI 

## 2019-09-15 NOTE — ED Provider Notes (Signed)
[facility] EMERGENCY DEPARTMENT Provider Note   CSN: [id number] Arrival date & time: 09/15/19  1024     History Chief Complaint  Patient presents with  . Numbness  . Tingling  . Dizziness    Audreyana B Kriegel is a 49 y.o. 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  She states she woke up sometime overnight and felt that her arm was numb and tingly.  She thought she slept on it wrong so she went back to sleep.  When she woke up this morning she felt better, however when she was at church she noticed her symptoms returned.  She reports a numbness feeling to her left arm and in entirety.  She also feels a pressure-like pain to her arm.  She feels lightheaded with a mild headache, as well as nausea.  She denies associated chest pain, shortness of breath, vision changes, difficulty speaking, facial droop, numbness in her legs, or weakness in her extremities.  She does endorse some neck pain worse in the left side.  She states she chronically has a sore neck due to sitting at a desk for her work.  She has no new injuries to her neck.  The history is provided by the patient.       Past Medical History:  Diagnosis Date  . Chronic kidney disease    kidney stones  . Papilloma of left breast     There are no problems to display for this patient.   Past Surgical History:  Procedure Laterality Date  . BREAST LUMPECTOMY WITH RADIOACTIVE SEED LOCALIZATION Left 02/06/2015   Procedure: LEFT BREAST LUMPECTOMY WITH RADIOACTIVE SEED LOCALIZATION;  Surgeon: Alphonsa Overall, MD;  Location: [facility];  Service: General;  Laterality: Left;  . KIDNEY STONE SURGERY       OB History   No obstetric history on file.     No family history on file.  Social History   Tobacco Use  . Smoking  status: Never Smoker  . Smokeless tobacco: Never Used  Substance Use Topics  . Alcohol use: No  . Drug use: No    Home Medications Prior to Admission medications   Medication Sig Start Date End Date Taking? Authorizing Provider  HYDROcodone-acetaminophen (NORCO/VICODIN) 5-325 MG tablet Take 1-2 tablets by mouth every 6 (six) hours as needed. Patient not taking: Reported on 09/15/2019 02/06/15   Alphonsa Overall, MD    Allergies    Fentanyl and Nizoral [ketoconazole]  Review of Systems   Review of Systems  Eyes: Negative for visual disturbance.  Respiratory: Negative for shortness of breath.   Cardiovascular: Negative for chest pain.  Gastrointestinal: Positive for nausea.  Neurological: Positive for light-headedness, numbness and headaches. Negative for facial asymmetry, speech difficulty and weakness.  All other systems reviewed and are negative.   Physical Exam Updated Vital Signs BP 132/88 (BP Location: Right Arm)   Pulse 64   Temp 98.6 F (37 C) (Oral)   Resp 16   SpO2 99%   Physical Exam Vitals and nursing note reviewed.  Constitutional:      Appearance: She is well-developed.  HENT:     Head: Normocephalic and atraumatic.  Eyes:     Conjunctiva/sclera: Conjunctivae normal.  Cardiovascular:     Rate and Rhythm: Normal rate and regular rhythm.  Pulmonary:  Effort: Pulmonary effort is normal. No respiratory distress.     Breath sounds: Normal breath sounds.  Abdominal:     General: Bowel sounds are normal.     Palpations: Abdomen is soft.     Tenderness: There is no abdominal tenderness.  Musculoskeletal:     Cervical back: Normal range of motion and neck supple. Tenderness (left superior trapezius region) present.  Skin:    General: Skin is warm.  Neurological:     Mental Status: She is alert.     Comments: Mental Status:  Alert, oriented, thought content appropriate, able to give a coherent history. Speech fluent without evidence of aphasia. Able to  follow 2 step commands without difficulty.  Cranial Nerves:  II:  Peripheral visual fields grossly normal, pupils equal, round, reactive to light III,IV, VI: ptosis not present, extra-ocular motions intact bilaterally  V,VII: smile symmetric, facial light touch sensation equal VIII: hearing grossly normal to voice  X: uvula elevates symmetrically  XI: bilateral shoulder shrug symmetric and strong XII: midline tongue extension without fassiculations Motor:  Normal tone. 5/5 strength in upper and lower extremities bilaterally including strong and equal grip strength and dorsiflexion/plantar flexion Sensory: diffusely decreased to LUE, normal in remaining extremities Cerebellar: normal finger-to-nose with bilateral upper extremities Gait: normal gait and balance CV: distal pulses palpable throughout  Psychiatric:        Behavior: Behavior normal.     ED Results / Procedures / Treatments   Labs (all labs ordered are listed, but only abnormal results are displayed) Labs Reviewed  CBC - Abnormal; Notable for the following components:      Result Value   RBC 5.16 (*)    All other components within normal limits  BASIC METABOLIC PANEL  URINALYSIS, ROUTINE W REFLEX MICROSCOPIC  CBG MONITORING, ED  TROPONIN I (HIGH SENSITIVITY)  TROPONIN I (HIGH SENSITIVITY)    EKG EKG Interpretation  Date/Time:  Sunday September 15 2019 10:45:45 EDT Ventricular Rate:  97 PR Interval:  156 QRS Duration: 80 QT Interval:  324 QTC Calculation: 411 R Axis:   95 Text Interpretation: Normal sinus rhythm with sinus arrhythmia Right atrial enlargement Rightward axis Pulmonary disease pattern Abnormal ECG No old tracing to compare Confirmed by Sherwood Gambler [id number]) on 09/15/2019 5:32:37 PM   Radiology CT Angio Head W/Cm &/Or Wo Cm  Result Date: 09/15/2019 CLINICAL DATA:  Acute neuro deficit. Headache lightheaded left arm numbness. EXAM: CT ANGIOGRAPHY HEAD AND NECK TECHNIQUE: Multidetector CT imaging of  the head and neck was performed using the standard protocol during bolus administration of intravenous contrast. Multiplanar CT image reconstructions and MIPs were obtained to evaluate the vascular anatomy. Carotid stenosis measurements (when applicable) are obtained utilizing NASCET criteria, using the distal internal carotid diameter as the denominator. CONTRAST:  73mL OMNIPAQUE IOHEXOL 350 MG/ML SOLN COMPARISON:  CT head 08/30/2006 FINDINGS: CT HEAD FINDINGS Brain: No evidence of acute infarction, hemorrhage, hydrocephalus, extra-axial collection or mass lesion/mass effect. Vascular: Negative for hyperdense vessel Skull: Negative Sinuses: Mild mucosal edema maxillary sinus bilaterally. Orbits: Negative Review of the MIP images confirms the above findings CTA NECK FINDINGS Aortic arch: Standard branching. Imaged portion shows no evidence of aneurysm or dissection. No significant stenosis of the major arch vessel origins. Right carotid system: Mild atherosclerotic calcification in the right carotid bulb. No significant right carotid stenosis. Left carotid system: Mild atherosclerotic calcification left carotid bulb without significant stenosis. Vertebral arteries: Right vertebral artery dominant and patent to the basilar. Hypoplastic left vertebral artery ends  in PICA Skeleton: Disc degeneration and spurring C5-6 and C6-7. No acute skeletal abnormality. Other neck: Prominent tonsils bilaterally with tonsilliths most likely due to chronic inflammation. No mass or adenopathy in the neck. Upper chest: Visualized lung apices clear bilaterally. Review of the MIP images confirms the above findings CTA HEAD FINDINGS Anterior circulation: Cavernous carotid normal bilaterally without stenosis or calcification. Anterior and middle cerebral arteries patent bilaterally without stenosis or large vessel occlusion. Posterior circulation: Right vertebral artery supplies the basilar. Right PICA patent. Small left vertebral artery  ends in PICA. Basilar widely patent. Patent superior cerebellar arteries. Small left P1 segment. No right P1 segment. Fetal origin of the posterior cerebral artery bilaterally without stenosis. Venous sinuses: Normal venous enhancement Anatomic variants: None Review of the MIP images confirms the above findings IMPRESSION: 1. Negative CT head 2. Mild atherosclerotic disease in the carotid bulb bilaterally without carotid stenosis. 3. Both vertebral arteries widely patent without stenosis. Left vertebral artery is small and ends in PICA 4. Negative for intracranial large vessel occlusion. Electronically Signed   By: Franchot Gallo M.D.   On: 09/15/2019 20:04   DG Chest 2 View  Result Date: 09/15/2019 CLINICAL DATA:  Lightheadedness, dizziness, numbness and tingling LEFT arm EXAM: CHEST - 2 VIEW COMPARISON:  None FINDINGS: Normal heart size, mediastinal contours, and pulmonary vascularity. Lungs clear. No pleural effusion or pneumothorax. Bones unremarkable. IMPRESSION: Normal exam. Electronically Signed   By: Lavonia Dana M.D.   On: 09/15/2019 17:53   CT Angio Neck W and/or Wo Contrast  Result Date: 09/15/2019 CLINICAL DATA:  Acute neuro deficit. Headache lightheaded left arm numbness. EXAM: CT ANGIOGRAPHY HEAD AND NECK TECHNIQUE: Multidetector CT imaging of the head and neck was performed using the standard protocol during bolus administration of intravenous contrast. Multiplanar CT image reconstructions and MIPs were obtained to evaluate the vascular anatomy. Carotid stenosis measurements (when applicable) are obtained utilizing NASCET criteria, using the distal internal carotid diameter as the denominator. CONTRAST:  85mL OMNIPAQUE IOHEXOL 350 MG/ML SOLN COMPARISON:  CT head 08/30/2006 FINDINGS: CT HEAD FINDINGS Brain: No evidence of acute infarction, hemorrhage, hydrocephalus, extra-axial collection or mass lesion/mass effect. Vascular: Negative for hyperdense vessel Skull: Negative Sinuses: Mild mucosal  edema maxillary sinus bilaterally. Orbits: Negative Review of the MIP images confirms the above findings CTA NECK FINDINGS Aortic arch: Standard branching. Imaged portion shows no evidence of aneurysm or dissection. No significant stenosis of the major arch vessel origins. Right carotid system: Mild atherosclerotic calcification in the right carotid bulb. No significant right carotid stenosis. Left carotid system: Mild atherosclerotic calcification left carotid bulb without significant stenosis. Vertebral arteries: Right vertebral artery dominant and patent to the basilar. Hypoplastic left vertebral artery ends in PICA Skeleton: Disc degeneration and spurring C5-6 and C6-7. No acute skeletal abnormality. Other neck: Prominent tonsils bilaterally with tonsilliths most likely due to chronic inflammation. No mass or adenopathy in the neck. Upper chest: Visualized lung apices clear bilaterally. Review of the MIP images confirms the above findings CTA HEAD FINDINGS Anterior circulation: Cavernous carotid normal bilaterally without stenosis or calcification. Anterior and middle cerebral arteries patent bilaterally without stenosis or large vessel occlusion. Posterior circulation: Right vertebral artery supplies the basilar. Right PICA patent. Small left vertebral artery ends in PICA. Basilar widely patent. Patent superior cerebellar arteries. Small left P1 segment. No right P1 segment. Fetal origin of the posterior cerebral artery bilaterally without stenosis. Venous sinuses: Normal venous enhancement Anatomic variants: None Review of the MIP images confirms the above findings  IMPRESSION: 1. Negative CT head 2. Mild atherosclerotic disease in the carotid bulb bilaterally without carotid stenosis. 3. Both vertebral arteries widely patent without stenosis. Left vertebral artery is small and ends in PICA 4. Negative for intracranial large vessel occlusion. Electronically Signed   By: Franchot Gallo M.D.   On: 09/15/2019  20:04   MR Brain Wo Contrast (neuro protocol)  Result Date: 09/15/2019 CLINICAL DATA:  Initial evaluation for acute left upper extremity numbness and tingling. EXAM: MRI HEAD WITHOUT CONTRAST MRI CERVICAL SPINE WITHOUT CONTRAST TECHNIQUE: Multiplanar, multiecho pulse sequences of the brain and surrounding structures, and cervical spine, to include the craniocervical junction and cervicothoracic junction, were obtained without intravenous contrast. COMPARISON:  Prior CTA from earlier same day. FINDINGS: MRI HEAD FINDINGS Brain: Cerebral volume within normal limits for age. No significant cerebral white matter disease. No abnormal foci of restricted diffusion to suggest acute or subacute ischemia. Gray-white matter differentiation maintained. No encephalomalacia to suggest chronic cortical infarction. No foci of susceptibility artifact to suggest acute or chronic intracranial hemorrhage. No mass lesion, midline shift or mass effect. No hydrocephalus or extra-axial fluid collection. Pituitary gland suprasellar region normal. Midline structures intact. Incidental note made of a probable small DVA at the right cerebellum. Vascular: Major intracranial vascular flow voids are maintained. Hypoplastic left vertebral artery again noted. Skull and upper cervical spine: Craniocervical junction within normal limits. Bone marrow signal intensity normal. No scalp soft tissue abnormality. Sinuses/Orbits: Globes and orbital soft tissues within normal limits. Mild mucosal thickening noted within the maxillary sinuses. Paranasal sinuses are otherwise clear. No mastoid effusion. Inner ear structures grossly normal. Other: None. MRI CERVICAL SPINE FINDINGS Alignment: Mild dextroscoliosis with straightening of the normal cervical lordosis. No listhesis. Vertebrae: Vertebral body height maintained without acute or chronic fracture. Bone marrow signal intensity within normal limits. Subcentimeter benign hemangioma noted within the T1  vertebral body. No other discrete or worrisome osseous lesions. No abnormal marrow edema. Cord: Normal signal and morphology. Posterior Fossa, vertebral arteries, paraspinal tissues: Craniocervical junction within normal limits. Paraspinous and prevertebral soft tissues are normal. Normal flow voids seen within the vertebral arteries bilaterally. Disc levels: C2-C3: Unremarkable. C3-C4:  Unremarkable. C4-C5:  Unremarkable. C5-C6: Degenerative intervertebral disc space narrowing with diffuse disc osteophyte complex. Broad posterior component flattens and partially effaces the ventral thecal sac resultant mild spinal stenosis. No cord deformity. Moderate right worse than left C6 foraminal narrowing. C6-C7: Degenerative intervertebral disc space narrowing with mild diffuse disc bulge and bilateral uncovertebral hypertrophy. Mild flattening of the ventral thecal sac without significant spinal stenosis. Mild right C7 foraminal narrowing. No significant left foraminal encroachment. C7-T1: Normal interspace. Right greater than left facet hypertrophy. No stenosis or impingement. Visualized upper thoracic spine demonstrates no significant finding. IMPRESSION: MRI HEAD IMPRESSION: Normal brain MRI. No acute intracranial abnormality or findings to explain patient's symptoms identified. MRI CERVICAL SPINE IMPRESSION: 1. No acute abnormality within the cervical spine. 2. Degenerative disc osteophyte at C5-6 with resultant mild spinal stenosis, with moderate bilateral C6 foraminal narrowing. 3. Disc bulge with uncovertebral hypertrophy at C6-7 with resultant mild right C7 foraminal stenosis. Electronically Signed   By: Jeannine Boga M.D.   On: 09/15/2019 23:03   MR Cervical Spine Wo Contrast  Result Date: 09/15/2019 CLINICAL DATA:  Initial evaluation for acute left upper extremity numbness and tingling. EXAM: MRI HEAD WITHOUT CONTRAST MRI CERVICAL SPINE WITHOUT CONTRAST TECHNIQUE: Multiplanar, multiecho pulse sequences  of the brain and surrounding structures, and cervical spine, to include the craniocervical junction and  cervicothoracic junction, were obtained without intravenous contrast. COMPARISON:  Prior CTA from earlier same day. FINDINGS: MRI HEAD FINDINGS Brain: Cerebral volume within normal limits for age. No significant cerebral white matter disease. No abnormal foci of restricted diffusion to suggest acute or subacute ischemia. Gray-white matter differentiation maintained. No encephalomalacia to suggest chronic cortical infarction. No foci of susceptibility artifact to suggest acute or chronic intracranial hemorrhage. No mass lesion, midline shift or mass effect. No hydrocephalus or extra-axial fluid collection. Pituitary gland suprasellar region normal. Midline structures intact. Incidental note made of a probable small DVA at the right cerebellum. Vascular: Major intracranial vascular flow voids are maintained. Hypoplastic left vertebral artery again noted. Skull and upper cervical spine: Craniocervical junction within normal limits. Bone marrow signal intensity normal. No scalp soft tissue abnormality. Sinuses/Orbits: Globes and orbital soft tissues within normal limits. Mild mucosal thickening noted within the maxillary sinuses. Paranasal sinuses are otherwise clear. No mastoid effusion. Inner ear structures grossly normal. Other: None. MRI CERVICAL SPINE FINDINGS Alignment: Mild dextroscoliosis with straightening of the normal cervical lordosis. No listhesis. Vertebrae: Vertebral body height maintained without acute or chronic fracture. Bone marrow signal intensity within normal limits. Subcentimeter benign hemangioma noted within the T1 vertebral body. No other discrete or worrisome osseous lesions. No abnormal marrow edema. Cord: Normal signal and morphology. Posterior Fossa, vertebral arteries, paraspinal tissues: Craniocervical junction within normal limits. Paraspinous and prevertebral soft tissues are normal.  Normal flow voids seen within the vertebral arteries bilaterally. Disc levels: C2-C3: Unremarkable. C3-C4:  Unremarkable. C4-C5:  Unremarkable. C5-C6: Degenerative intervertebral disc space narrowing with diffuse disc osteophyte complex. Broad posterior component flattens and partially effaces the ventral thecal sac resultant mild spinal stenosis. No cord deformity. Moderate right worse than left C6 foraminal narrowing. C6-C7: Degenerative intervertebral disc space narrowing with mild diffuse disc bulge and bilateral uncovertebral hypertrophy. Mild flattening of the ventral thecal sac without significant spinal stenosis. Mild right C7 foraminal narrowing. No significant left foraminal encroachment. C7-T1: Normal interspace. Right greater than left facet hypertrophy. No stenosis or impingement. Visualized upper thoracic spine demonstrates no significant finding. IMPRESSION: MRI HEAD IMPRESSION: Normal brain MRI. No acute intracranial abnormality or findings to explain patient's symptoms identified. MRI CERVICAL SPINE IMPRESSION: 1. No acute abnormality within the cervical spine. 2. Degenerative disc osteophyte at C5-6 with resultant mild spinal stenosis, with moderate bilateral C6 foraminal narrowing. 3. Disc bulge with uncovertebral hypertrophy at C6-7 with resultant mild right C7 foraminal stenosis. Electronically Signed   By: Jeannine Boga M.D.   On: 09/15/2019 23:03    Procedures Procedures (including critical care time)  Medications Ordered in ED Medications  iohexol (OMNIPAQUE) 350 MG/ML injection 75 mL (75 mLs Intravenous Contrast Given 09/15/19 1946)    ED Course  I have reviewed the triage vital signs and the nursing notes.  Pertinent labs & imaging results that were available during my care of the patient were reviewed by me and considered in my medical decision making (see chart for details).  Clinical Course as of Sep 15 8  Sun Sep 15, 2019  2318 Discussed reassuring results  with patient, she verbalized understanding.    [JR]    Clinical Course User Index [JR] Lera Gaines, [place] N, PA-C   MDM Rules/Calculators/A&P                          Pt presenting with left arm tingling and decreased sensation, initially began over night, improved and then returned and is constant.  She also reports some lightheadedness, nausea, and mild headache. She denies weakness, CP, or other symptoms. She is very well-appearing and in no distress.  Heart and lung sounds are normal.  She does have decreased sensation to the entire left arm, however no weakness.  No other focal neuro deficits appreciated.  Given patient presentation, cardiac work-up initiated, and edition CTA head and neck are ordered to evaluate for possible neurologic cause.  Cardiac work-up is negative, CTA is also negative.  Discussed with attending Dr. Regenia Skeeter.  Obtained MRI head and C-spine - also negative for stroke or obvious cause of symptoms. At this time, patient is appropriate for discharge with outpatient follow up. Neurology referral provided for follow up. Strict return precautions discussed.  Discussed results, findings, treatment and follow up. Patient advised of return precautions. Patient verbalized understanding and agreed with plan.   Final Clinical Impression(s) / ED Diagnoses Final diagnoses:  Paresthesia    Rx / DC Orders ED Discharge Orders    None       Lygia Olaes, [place] N, PA-C 09/16/19 0010    Sherwood Gambler, MD 09/18/19 1000

## 2019-09-15 NOTE — ED Notes (Signed)
Pt ambulated well to the BR

## 2019-09-15 NOTE — ED Notes (Signed)
Pt transported to CT ?

## 2019-09-15 NOTE — Discharge Instructions (Signed)
Please follow closely with your primary care. Schedule an appointment with neurology if symptoms persist. Return to the ED if symptoms worsen, or if you develop accompanying chest pain, weakness in your extremities, facial droop, slurred speech, vision changes, or problems with balance.

## 2019-09-15 NOTE — ED Triage Notes (Signed)
Pt. Stated, I woke up during the night with my left arm being numb and tingling. Ive also been light headed with a slight headache. I went to church and it continued.

## 2019-12-31 ENCOUNTER — Other Ambulatory Visit: Payer: Self-pay | Admitting: Obstetrics and Gynecology

## 2019-12-31 DIAGNOSIS — R928 Other abnormal and inconclusive findings on diagnostic imaging of breast: Secondary | ICD-10-CM

## 2020-01-08 ENCOUNTER — Other Ambulatory Visit: Payer: Self-pay | Admitting: Obstetrics and Gynecology

## 2020-01-08 ENCOUNTER — Ambulatory Visit
Admission: RE | Admit: 2020-01-08 | Discharge: 2020-01-08 | Disposition: A | Discharge status: Home / Self Care | Payer: BC Managed Care – PPO | Source: Ambulatory Visit | Attending: Obstetrics and Gynecology | Admitting: Obstetrics and Gynecology

## 2020-01-08 ENCOUNTER — Other Ambulatory Visit: Payer: Self-pay

## 2020-01-08 DIAGNOSIS — R928 Other abnormal and inconclusive findings on diagnostic imaging of breast: Secondary | ICD-10-CM

## 2020-07-09 ENCOUNTER — Other Ambulatory Visit: Payer: BC Managed Care – PPO

## 2020-08-10 ENCOUNTER — Other Ambulatory Visit: Payer: Self-pay | Admitting: Obstetrics and Gynecology

## 2020-08-10 ENCOUNTER — Ambulatory Visit
Admission: RE | Admit: 2020-08-10 | Discharge: 2020-08-10 | Disposition: A | Discharge status: Home / Self Care | Payer: BC Managed Care – PPO | Source: Ambulatory Visit | Attending: Obstetrics and Gynecology | Admitting: Obstetrics and Gynecology

## 2020-08-10 ENCOUNTER — Other Ambulatory Visit: Payer: Self-pay

## 2020-08-10 DIAGNOSIS — R928 Other abnormal and inconclusive findings on diagnostic imaging of breast: Secondary | ICD-10-CM

## 2021-02-17 ENCOUNTER — Other Ambulatory Visit: Payer: BC Managed Care – PPO

## 2021-02-26 ENCOUNTER — Other Ambulatory Visit: Payer: BC Managed Care – PPO

## 2021-02-26 DIAGNOSIS — Z1231 Encounter for screening mammogram for malignant neoplasm of breast: Secondary | ICD-10-CM

## 2021-03-23 ENCOUNTER — Ambulatory Visit
Admission: RE | Admit: 2021-03-23 | Discharge: 2021-03-23 | Disposition: A | Discharge status: Home / Self Care | Payer: BC Managed Care – PPO | Source: Ambulatory Visit | Attending: Obstetrics and Gynecology | Admitting: Obstetrics and Gynecology

## 2021-03-23 DIAGNOSIS — R928 Other abnormal and inconclusive findings on diagnostic imaging of breast: Secondary | ICD-10-CM

## 2023-09-30 ENCOUNTER — Encounter (HOSPITAL_BASED_OUTPATIENT_CLINIC_OR_DEPARTMENT_OTHER): Payer: Self-pay

## 2023-09-30 ENCOUNTER — Ambulatory Visit (HOSPITAL_BASED_OUTPATIENT_CLINIC_OR_DEPARTMENT_OTHER)
Admission: RE | Admit: 2023-09-30 | Discharge: 2023-09-30 | Disposition: A | Discharge status: Home / Self Care | Attending: Family Medicine | Admitting: Family Medicine

## 2023-09-30 VITALS — BP 132/82 | HR 93 | Temp 98.5°F | Resp 20

## 2023-09-30 DIAGNOSIS — H60392 Other infective otitis externa, left ear: Secondary | ICD-10-CM | POA: Diagnosis not present

## 2023-09-30 MED ORDER — CIPROFLOXACIN-DEXAMETHASONE 0.3-0.1 % OT SUSP: 4.0000 [drp] | Freq: Two times a day (BID) | OTIC | 0 refills | Status: AC

## 2023-09-30 NOTE — ED Triage Notes (Signed)
 Left ear pain onset middle of the night. Pain with palpation around ear and states hearing is affected. Has used ear wax drops and attempted to use an ear wick but unable to use it due to pain.

## 2023-09-30 NOTE — ED Provider Notes (Signed)
 April Wilcox    CSN: [id number] Arrival date & time: 09/30/23  1312      History   Chief Complaint Chief Complaint  Patient presents with   Ear Fullness    Entered by patient    HPI April Wilcox is a 53 y.o. female.   Patient is a 53 year old female presents today with ear pain.  Left ear pain onset middle of the night. Pain with palpation around ear and states hearing is affected. Has used ear wax drops and attempted to use an ear wick but unable to use it due to pain.  No fever, chills    Ear Fullness    Past Medical History:  Diagnosis Date   Chronic kidney disease    kidney stones   Papilloma of left breast     There are no active problems to display for this patient.   Past Surgical History:  Procedure Laterality Date   BREAST LUMPECTOMY WITH RADIOACTIVE SEED LOCALIZATION Left 02/06/2015   Procedure: LEFT BREAST LUMPECTOMY WITH RADIOACTIVE SEED LOCALIZATION;  Surgeon: Alm Angle, MD;  [facility];  Service: General;  Laterality: Left;   KIDNEY STONE SURGERY      OB History   No obstetric history on file.      Home Medications    Prior to Admission medications   Medication Sig Start Date End Date Taking? Authorizing Provider  ciprofloxacin -dexamethasone  (CIPRODEX ) OTIC suspension Place 4 drops into the left ear 2 (two) times daily for 7 days. 09/30/23 10/07/23 Yes Adah Wilbert LABOR, FNP    Family History History reviewed. No pertinent family history.  Social History Social History   Tobacco Use   Smoking status: Never   Smokeless tobacco: Never  Substance Use Topics   Alcohol use: No   Drug use: No     Allergies   Fentanyl  and Nizoral [ketoconazole]   Review of Systems Review of Systems See HPI  Physical Exam Triage Vital Signs ED Triage Vitals  Encounter Vitals Group     BP 09/30/23 1340 132/82     Girls Systolic BP Percentile --      Girls Diastolic BP Percentile --      Boys Systolic BP  Percentile --      Boys Diastolic BP Percentile --      Pulse Rate 09/30/23 1340 93     Resp 09/30/23 1340 20     Temp 09/30/23 1340 98.5 F (36.9 C)     Temp src --      SpO2 09/30/23 1340 98 %     Weight --      Height --      Head Circumference --      Peak Flow --      Pain Score 09/30/23 1342 2     Pain Loc --      Pain Education --      Exclude from Growth Chart --    No data found.  Updated Vital Signs BP 132/82 (BP Location: Right Arm)   Pulse 93   Temp 98.5 F (36.9 C)   Resp 20   SpO2 98%   Visual Acuity Right Eye Distance:   Left Eye Distance:   Bilateral Distance:    Right Eye Near:   Left Eye Near:    Bilateral Near:     Physical Exam Vitals and nursing note reviewed.  Constitutional:      General: She is not in acute distress.    Appearance:  Normal appearance. She is not ill-appearing, toxic-appearing or diaphoretic.  HENT:     Head:     Comments: Left ear with tender to palpation to external ear and canal swelling.  Unable to view the TM    Right Ear: Tympanic membrane, ear canal and external ear normal.  Pulmonary:     Effort: Pulmonary effort is normal.  Neurological:     Mental Status: She is alert.  Psychiatric:        Mood and Affect: Mood normal.      UC Treatments / Results  Labs (all labs ordered are listed, but only abnormal results are displayed) Labs Reviewed - No data to display  EKG   Radiology No results found.  Procedures Procedures (including critical Wilcox time)  Medications Ordered in UC Medications - No data to display  Initial Impression / Assessment and Plan / UC Course  I have reviewed the triage vital signs and the nursing notes.  Pertinent labs & imaging results that were available during my Wilcox of the patient were reviewed by me and considered in my medical decision making (see chart for details).     Otitis externa of the left ear.  Treating with Ciprodex .  Recommend warm compresses to the outside  of the ear.  Ibuprofen and Tylenol  for pain as needed.  Follow-up as needed Final Clinical Impressions(s) / UC Diagnoses   Final diagnoses:  Infective otitis externa of left ear     Discharge Instructions      Treating you for an outer ear infection.  Use the drops as prescribed.  Lay on the unaffected side for at least 15 minutes after applying the drops.  Warm compresses to the outside of the ear.  Ibuprofen and Tylenol  for pain as needed.  Follow-up as needed   ED Prescriptions     Medication Sig Dispense Auth. Provider   ciprofloxacin -dexamethasone  (CIPRODEX ) OTIC suspension Place 4 drops into the left ear 2 (two) times daily for 7 days. 2.8 mL Adah Wilbert LABOR, FNP      PDMP not reviewed this encounter.   Adah Wilbert LABOR, FNP 10/01/23 1247

## 2023-09-30 NOTE — Discharge Instructions (Signed)
 Treating you for an outer ear infection.  Use the drops as prescribed.  Lay on the unaffected side for at least 15 minutes after applying the drops.  Warm compresses to the outside of the ear.  Ibuprofen and Tylenol  for pain as needed.  Follow-up as needed
